# Patient Record
Sex: Female | Born: 2005 | Race: White | Hispanic: No | Marital: Single | Smoking: Never smoker
Health system: Southern US, Community
[De-identification: ages and names within clinical notes are randomized; demographics above are authoritative.]

## PROBLEM LIST (undated history)

## (undated) DIAGNOSIS — F32A Depression, unspecified: Secondary | ICD-10-CM

## (undated) DIAGNOSIS — F909 Attention-deficit hyperactivity disorder, unspecified type: Secondary | ICD-10-CM

## (undated) DIAGNOSIS — G43909 Migraine, unspecified, not intractable, without status migrainosus: Secondary | ICD-10-CM

---
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
## 2024-01-03 NOTE — Progress Notes (Signed)
 Chief Complaint:   Hematuria    History of Present Illness   Ms. Vanwyk is a 18 y.o. female G0P0000 here for UTI symptoms  Patient concerns:  Chief Complaint:  Onset: about 1 week Duration: worse first thing in the morning and last thing at night Characteristics: blood in urine, suprapubic pain at the end of stream, urgency and frequency Treatment: AZO Associated signs and symptoms: denies vaginal symptoms 10/23/23 wet prep pos for BV; 09/06/23 wet prep pos for BV; 06/07/22 urine culture pos for GBS; 09/28/21 urine culture pos for E. coli   GYNHx:  - Patient's last menstrual period was 12/15/2023.SABRA    Sexually active: active, 1 female partner.  Current contraception: Condoms   Wt Mgmt Body mass index is 27.12 kg/m.  OB/GYN History:  OB History  Gravida Para Term Preterm AB Living  0 0 0 0 0 0  SAB IAB Ectopic Molar Multiple Live Births  0 0 0 0 0 0    Problem list: Patient Active Problem List  Diagnosis  . ADHD (attention deficit hyperactivity disorder), combined type  . Depression, endogenous (CMS/HHS-HCC)    Medical history: Past Medical History:  Diagnosis Date  . ADHD (attention deficit hyperactivity disorder)   . Depression   . Migraines     Past surgical history: Past Surgical History:  Procedure Laterality Date  . wisdom teeth extractions  02/2022  . ESOPHAGOGASTRODOUDENOSCOPY W/BIOPSY N/A 09/20/2022   Procedure: ESOPHAGOGASTRODUODENOSCOPY, FLEXIBLE, TRANSORAL; WITH BIOPSY, SINGLE OR MULTIPLE;  Surgeon: Marcus Charlie Pac, MD;  Location: [facility];  Service: Pediatric Gastroenterology;  Laterality: N/A;  Please get disaccharideases    Medications:  Current Outpatient Medications:  .  brompheniramine-pseudoephed-DM (BROMFED DM) 2-30-10 mg/5 mL syrup, Take 5 mLs by mouth every 6 (six) hours as needed (Patient not taking: Reported on 09/06/2023), Disp: 118 mL, Rfl: 0 .  chlorhexidine (PERIDEX) 0.12 % solution, , Disp: , Rfl:  .   dexmethylphenidate (FOCALIN) 5 MG tablet, , Disp: , Rfl:  .  DULoxetine  (CYMBALTA ) 60 MG DR capsule, Take 60 mg by mouth once daily (Patient not taking: Reported on 09/06/2023), Disp: , Rfl:  .  hydrOXYzine  (ATARAX ) 25 MG tablet, Take by mouth (Patient not taking: Reported on 09/06/2023), Disp: , Rfl:  .  multivit-min/ferrous fumarate (MULTI VITAMIN ORAL), Take by mouth once daily Teen, takes 2 QD (Patient not taking: Reported on 01/03/2024), Disp: , Rfl:  .  ondansetron  (ZOFRAN -ODT) 4 MG disintegrating tablet, , Disp: , Rfl:  .  XULANE 150-35 mcg/24 hr patch, Place 1 patch onto the skin once a week (Patient not taking: Reported on 10/23/2023), Disp: 12 patch, Rfl: 3  Allergies: No Known Allergies  Social History: Social History   Socioeconomic History  . Marital status: Single  Tobacco Use  . Smoking status: Never  . Smokeless tobacco: Never  Vaping Use  . Vaping status: Never Used  Substance and Sexual Activity  . Alcohol use: Never  . Drug use: Never  . Sexual activity: Defer    Birth control/protection: Patch  Social History Narrative   Parents are divorced, split custody stays  1 week with each parent, has 1 half brother and 1 half sister   Dad smokes outside   Pets dogs   Social Drivers of Health   Housing Stability: Unknown (10/23/2023)   Housing Stability Vital Sign   . Homeless in the Last Year: No    Family History: Family History  Problem Relation Name Age of Onset  . Thyroid  disease Mother    . No Known Problems Father    . Breast cancer Maternal Grandmother    . Diabetes type II Maternal Grandmother    . Thyroid disease Maternal Grandmother    . Anesthesia problems Neg Hx       Review of Systems:   ROS: see HPI for pertinent positives and negatives, otherwise a 10 system review is negative.    Exam:  Constitutional: Vitals:   01/03/24 0830  BP: 114/72  Pulse: 64   Body mass index is 27.12 kg/m.  General Appearance:    Well-developed,  well-nourished, no acute distress, appears stated age  Lungs:     Respirations unlabored  Neuro: Psych:   Alert, oriented x3  Appropriate mood and insight, judgement intact   Results for orders placed or performed in visit on 01/03/24  Urinalysis w/Microscopic  Result Value Ref Range   Color Yellow Colorless, Straw, Light Yellow, Yellow, Dark Yellow   Clarity Cloudy (!) Clear   Specific Gravity 1.017 1.005 - 1.030   pH, Urine 6.5 5.0 - 8.0   Protein, Urinalysis Trace (!) Negative mg/dL   Glucose, Urinalysis Negative Negative mg/dL   Ketones, Urinalysis Negative Negative mg/dL   Blood, Urinalysis 1+ (!) Negative   Nitrite, Urinalysis Negative Negative   Leukocyte Esterase, Urinalysis 3+ (!) Negative   Bilirubin, Urinalysis Negative Negative   Urobilinogen, Urinalysis 0.2 0.2 - 1.0 mg/dL   WBC, UA >817 (H) <=5 /hpf   Red Blood Cells, Urinalysis 4 (H) <=3 /hpf   Bacteria, Urinalysis 0-5 0 - 5 /hpf   Squamous Epithelial Cells, Urinalysis 4 /hpf  Wet Prep  Result Value Ref Range   Clue Cells, Vaginal Present (!) None Seen   WBC (White Blood Cells), Vaginal None Seen None Seen   Trichomonas, Vaginal None Seen None Seen   Bacteria, Vaginal Many (!) None Seen   Yeast, Vaginal None Seen None Seen   Narrative   Whiff test positive      Impression:   Ms. Sovine is a 18 y.o. G17P0000 female here for UTI symptoms   The primary encounter diagnosis was Urinary frequency. A diagnosis of Pelvic pain was also pertinent to this visit.  Plan:   1. Urinary frequency -     Urinalysis w/Microscopic -     Urine Culture, Routine - [facility]  2. Pelvic pain -     Wet Prep   Requested Prescriptions   Pending Prescriptions Disp Refills  . nitrofurantoin, macrocrystal-monohydrate, (MACROBID) 100 MG capsule 10 capsule 0    Sig: Take 1 capsule (100 mg total) by mouth every 12 (twelve) hours for 5 days  . metroNIDAZOLE (FLAGYL) 500 MG tablet 14 tablet 0    Sig: Take 1 tablet (500 mg total) by  mouth 2 (two) times daily for 7 days    Orders Placed This Encounter  Procedures  . Urine Culture, Routine - [facility]    Order Specific Question:   Release to patient    Answer:   Immediate  . Urinalysis w/Microscopic    Order Specific Question:   Release to patient    Answer:   Immediate  . Wet Prep    Order Specific Question:   Release to patient    Answer:   Immediate    Problem List Items Addressed This Visit   None Visit Diagnoses     Urinary frequency    -  Primary   Relevant Orders   Urinalysis w/Microscopic (Completed)   Urine Culture,  Routine - [facility]   Pelvic pain       Relevant Orders   Wet Prep (Completed)       Return if symptoms worsen or fail to improve.   Attestation Statement:   I personally performed the service, non-incident to. [facility])   JENNIFER RICHARDSON MYRON DELON MYRON, CNM 01/03/2024 9:41 AM

## 2024-02-29 NOTE — Progress Notes (Signed)
 Subjective:     Patient ID: Angela Reed is a 18 y.o.  She does not play contact sports  Sore Throat  This is a new problem. The current episode started in the past 7 days (3d). The problem has been unchanged. Neither side of throat is experiencing more pain than the other. There has been no fever. The pain is mild. Associated symptoms include coughing and headaches. Pertinent negatives include no abdominal pain, congestion, diarrhea, drooling, ear discharge, ear pain, hoarse voice, plugged ear sensation, neck pain, shortness of breath, swollen glands, trouble swallowing or vomiting. She has had no exposure to strep or mono. She has tried nothing for the symptoms.    The following portions of the patient's history were reviewed and updated as appropriate.  Past Medical History:  has a past medical history of ADHD (attention deficit hyperactivity disorder), Depression, and Migraines.  Problem List: has ADHD (attention deficit hyperactivity disorder), combined type and Depression, endogenous (CMS/HHS-HCC) on their problem list.  Past Surgical History:  has a past surgical history that includes wisdom teeth extractions (02/2022) and esophagogastrodoudenoscopy w/biopsy (N/A, 09/20/2022).  Family History: family history includes Breast cancer in her maternal grandmother; Diabetes type II in her maternal grandmother; No Known Problems in her father; Thyroid disease in her maternal grandmother and mother.  Social History:  reports that she has never smoked. She has never been exposed to tobacco smoke. She has never used smokeless tobacco. She reports that she does not drink alcohol and does not use drugs.  Current Medications: has a current medication list which includes the following prescription(s): brompheniramine-pseudoephed-dm, chlorhexidine, dexmethylphenidate, duloxetine , hydroxyzine , multivit-min/ferrous fumarate, ondansetron , and xulane.  Prior to encounter Medications:  Current  Outpatient Medications on File Prior to Visit  Medication Sig Dispense Refill  . brompheniramine-pseudoephed-DM (BROMFED DM) 2-30-10 mg/5 mL syrup Take 5 mLs by mouth every 6 (six) hours as needed (Patient not taking: Reported on 02/29/2024) 118 mL 0  . chlorhexidine (PERIDEX) 0.12 % solution  (Patient not taking: Reported on 02/29/2024)    . dexmethylphenidate (FOCALIN) 5 MG tablet  (Patient not taking: Reported on 01/16/2023)    . DULoxetine  (CYMBALTA ) 60 MG DR capsule Take 60 mg by mouth once daily (Patient not taking: Reported on 02/29/2024)    . hydrOXYzine  (ATARAX ) 25 MG tablet Take by mouth (Patient not taking: Reported on 02/29/2024)    . multivit-min/ferrous fumarate (MULTI VITAMIN ORAL) Take by mouth once daily Teen, takes 2 QD (Patient not taking: Reported on 02/29/2024)    . ondansetron  (ZOFRAN -ODT) 4 MG disintegrating tablet  (Patient not taking: Reported on 02/29/2024)    . XULANE 150-35 mcg/24 hr patch Place 1 patch onto the skin once a week (Patient not taking: Reported on 10/23/2023) 12 patch 3   No current facility-administered medications on file prior to visit.    Allergies: has No Known Allergies.  Review of Systems  Constitutional:  Negative for chills, fever and malaise/fatigue.  HENT:  Positive for sore throat. Negative for congestion, drooling, ear discharge, ear pain, hoarse voice, sinus pain, tinnitus and trouble swallowing.   Eyes:  Negative for pain, discharge and redness.  Respiratory:  Positive for cough. Negative for sputum production, shortness of breath and wheezing.   Cardiovascular:  Negative for chest pain.  Gastrointestinal:  Negative for abdominal pain, diarrhea, nausea and vomiting.  Musculoskeletal:  Negative for myalgias and neck pain.  Skin:  Negative for rash.  Neurological:  Positive for headaches.       Objective:  Physical Exam Vitals and nursing note reviewed.  Constitutional:      General: She is not in acute distress.    Appearance: Normal  appearance. She is well-developed. She is not ill-appearing.  HENT:     Head: Normocephalic and atraumatic.     Right Ear: Tympanic membrane, ear canal and external ear normal.     Left Ear: Tympanic membrane, ear canal and external ear normal.     Nose: Nose normal. No congestion or rhinorrhea.     Mouth/Throat:     Mouth: Mucous membranes are moist.     Pharynx: Posterior oropharyngeal erythema (mild) and postnasal drip present. No oropharyngeal exudate.     Tonsils: No tonsillar exudate or tonsillar abscesses. 0 on the right. 0 on the left.  Eyes:     Conjunctiva/sclera: Conjunctivae normal.     Pupils: Pupils are equal, round, and reactive to light.  Neck:     Thyroid: No thyromegaly.     Trachea: No tracheal deviation.  Cardiovascular:     Rate and Rhythm: Normal rate and regular rhythm.     Heart sounds: Normal heart sounds.  Pulmonary:     Effort: Pulmonary effort is normal.     Breath sounds: Normal breath sounds.  Chest:     Chest wall: No tenderness.  Abdominal:     General: Abdomen is flat. There is no distension.     Palpations: There is no splenomegaly.     Tenderness: There is no abdominal tenderness.  Musculoskeletal:        General: Normal range of motion.     Cervical back: Normal range of motion and neck supple.  Lymphadenopathy:     Cervical: No cervical adenopathy.  Skin:    General: Skin is warm and dry.  Neurological:     General: No focal deficit present.     Mental Status: She is alert and oriented to person, place, and time.  Psychiatric:        Mood and Affect: Mood normal.        Behavior: Behavior normal.     Results for orders placed or performed in visit on 02/29/24  Xpert Dx Strep A, PCR - [facility]   Specimen: Throat  Result Value Ref Range   Xpress Strep A, PCR Not Detected Not Detected, INVALID  Pregnancy Test (HCG), Urine Qualitative  Result Value Ref Range   Pregnancy Test (HCG), Qual Urine Negative Negative  Mononucleosis Spot  Test  Result Value Ref Range   Mononucleosis Spot Test Negative Negative   Narrative   Caution: False negative results are more likely in children under 27 years of age.  CBC w/auto Differential (5 Part)  Result Value Ref Range   WBC (White Blood Cell Count) 5.2 4.1 - 10.2 10^3/uL   RBC (Red Blood Cell Count) 4.47 4.04 - 5.48 10^6/uL   Hemoglobin 12.4 12.0 - 15.0 gm/dL   Hematocrit 61.1 64.9 - 47.0 %   MCV (Mean Corpuscular Volume) 86.8 80.0 - 100.0 fl   MCH (Mean Corpuscular Hemoglobin) 27.7 27.0 - 31.2 pg   MCHC (Mean Corpuscular Hemoglobin Concentration) 32.0 32.0 - 36.0 gm/dL   Platelet Count 758 849 - 450 10^3/uL   RDW-CV (Red Cell Distribution Width) 13.9 11.6 - 14.8 %   MPV (Mean Platelet Volume) 10.5 9.4 - 12.4 fl   Neutrophils 2.53 1.50 - 7.80 10^3/uL   Lymphocytes 1.87 1.00 - 3.60 10^3/uL   Monocytes 0.66 0.00 - 1.50 10^3/uL   Eosinophils 0.07  0.00 - 0.55 10^3/uL   Basophils 0.04 0.00 - 0.09 10^3/uL   Neutrophil % 48.8 32.0 - 70.0 %   Lymphocyte % 36.1 28.0 - 48.0 %   Monocyte % 12.7 4.0 - 13.0 %   Eosinophil % 1.4 1.0 - 5.0 %   Basophil% 0.8 0.0 - 2.0 %   Immature Granulocyte % 0.2 <=0.7 %   Immature Granulocyte Count 0.01 <=0.06 10^3/L      Assessment:     1. Viral pharyngitis  2. Sore throat, unspecified -     Xpert Dx Strep A, PCR - [facility] -     Mononucleosis Spot Test -     CBC w/auto Differential (5 Part)  3. Possible pregnancy -     Pregnancy Test (HCG), Urine Qualitative       Plan:     Patient Instructions  Your strep and mono are negative today. You likely have a viral sore throat that will resolve on its own in a few days. Warm salt water gargles and hot teas with honey can help. Tylenol and ibuprofen are helpful for pain. If symptoms worsen or persist, please return to clinic.  Try OTC flonase to help with postnasal drip that may be contributing to your sore throat.

## 2024-05-14 ENCOUNTER — Encounter: Payer: Self-pay | Admitting: Emergency Medicine

## 2024-05-14 ENCOUNTER — Emergency Department
Admission: EM | Admit: 2024-05-14 | Discharge: 2024-05-14 | Disposition: A | Discharge status: Home / Self Care | Attending: Emergency Medicine | Admitting: Emergency Medicine

## 2024-05-14 ENCOUNTER — Other Ambulatory Visit: Payer: Self-pay

## 2024-05-14 DIAGNOSIS — R197 Diarrhea, unspecified: Secondary | ICD-10-CM | POA: Insufficient documentation

## 2024-05-14 DIAGNOSIS — R109 Unspecified abdominal pain: Secondary | ICD-10-CM | POA: Diagnosis present

## 2024-05-14 DIAGNOSIS — R112 Nausea with vomiting, unspecified: Secondary | ICD-10-CM | POA: Insufficient documentation

## 2024-05-14 LAB — URINALYSIS, ROUTINE W REFLEX MICROSCOPIC
Bilirubin Urine: NEGATIVE
Glucose, UA: NEGATIVE mg/dL
Hgb urine dipstick: NEGATIVE
Ketones, ur: 80 mg/dL — AB
Nitrite: NEGATIVE
Protein, ur: 100 mg/dL — AB
Specific Gravity, Urine: 1.028 (ref 1.005–1.030)
pH: 5 (ref 5.0–8.0)

## 2024-05-14 LAB — COMPREHENSIVE METABOLIC PANEL WITH GFR
ALT: 12 U/L (ref 0–44)
AST: 18 U/L (ref 15–41)
Albumin: 4.4 g/dL (ref 3.5–5.0)
Alkaline Phosphatase: 68 U/L (ref 38–126)
Anion gap: 16 — ABNORMAL HIGH (ref 5–15)
BUN: 9 mg/dL (ref 6–20)
CO2: 17 mmol/L — ABNORMAL LOW (ref 22–32)
Calcium: 9.4 mg/dL (ref 8.9–10.3)
Chloride: 103 mmol/L (ref 98–111)
Creatinine, Ser: 0.76 mg/dL (ref 0.44–1.00)
GFR, Estimated: >60 mL/min (ref >60)
Glucose, Bld: 78 mg/dL (ref 70–99)
Potassium: 3.5 mmol/L (ref 3.5–5.1)
Sodium: 136 mmol/L (ref 135–145)
Total Bilirubin: 1 mg/dL (ref 0.0–1.2)
Total Protein: 8 g/dL (ref 6.5–8.1)

## 2024-05-14 LAB — CBC
HCT: 40.1 % (ref 36.0–46.0)
Hemoglobin: 12.9 g/dL (ref 12.0–15.0)
MCH: 27.3 pg (ref 26.0–34.0)
MCHC: 32.2 g/dL (ref 30.0–36.0)
MCV: 84.8 fL (ref 80.0–100.0)
Platelets: 316 K/uL (ref 150–400)
RBC: 4.73 MIL/uL (ref 3.87–5.11)
RDW: 14.3 % (ref 11.5–15.5)
WBC: 5.5 K/uL (ref 4.0–10.5)
nRBC: 0 % (ref 0.0–0.2)

## 2024-05-14 LAB — LIPASE, BLOOD: Lipase: 28 U/L (ref 11–51)

## 2024-05-14 LAB — HCG, QUANTITATIVE, PREGNANCY: hCG, Beta Chain, Quant, S: <1 m[IU]/mL (ref <5)

## 2024-05-14 MED ORDER — LACTATED RINGERS IV BOLUS
1000.0000 mL | Freq: Once | INTRAVENOUS | Status: AC
  Administered 2024-05-14: 1000 mL via INTRAVENOUS

## 2024-05-14 MED ORDER — ONDANSETRON HCL 4 MG/2ML IJ SOLN
4.0000 mg | Freq: Once | INTRAMUSCULAR | Status: AC
  Administered 2024-05-14: 4 mg via INTRAVENOUS
  Filled 2024-05-14: qty 2

## 2024-05-14 MED ORDER — DROPERIDOL 2.5 MG/ML IJ SOLN
1.2500 mg | Freq: Once | INTRAMUSCULAR | Status: AC
  Administered 2024-05-14: 1.25 mg via INTRAVENOUS
  Filled 2024-05-14: qty 2

## 2024-05-14 NOTE — ED Notes (Signed)
 Patient ambulated with a steady gait to hallway bathroom

## 2024-05-14 NOTE — ED Notes (Signed)
 Patient denies vomiting, but states she is spitting up saliva and dry heaving.

## 2024-05-14 NOTE — ED Provider Notes (Signed)
 Angela Reed Provider Note    Event Date/Time   First MD Initiated Contact with Patient 05/14/24 1010     (approximate)   History   Diarrhea   HPI  Angela Reed is a 18 y.o. female with history of anxiety, MDD, presenting with abdominal pain and diarrhea.  Has been ongoing for the past week.  Also notes intermittent nausea vomiting.  Does state that she also a 10-15 pound weight loss due to decreased p.o. intake secondary to the nausea.  She denies any urinary symptoms, no vaginal discharge or bleeding, states that she just completed her period and her cycles are pretty regular.  She denies any fever or recent travel.  No sick contacts.  No family history of GI cancers or IBD.  She does not have any history of IBD, IBS, celiac's disease.  She denies any hematochezia or melena.  States that she had an endoscopy in the past for nausea vomiting and crampy abdominal pain, states that the endoscopy was negative and her symptoms were attributed to anxiety.  Patient states that she has been under a lot of stress lately.  Went to see her primary care doctor yesterday and was put on azithromycin for possible traveler's diarrhea.  She denies any marijuana use.  No other new medications.  On independent review, was seen by OB in March of this year, is sexually active with 1 female partner, does have prior history of BV.  Patient states that she does not have prior STI history.     Physical Exam   Triage Vital Signs: ED Triage Vitals  Encounter Vitals Group     BP 05/14/24 0932 130/89     Girls Systolic BP Percentile --      Girls Diastolic BP Percentile --      Boys Systolic BP Percentile --      Boys Diastolic BP Percentile --      Pulse Rate 05/14/24 0932 85     Resp 05/14/24 0932 16     Temp 05/14/24 0932 98.3 F (36.8 C)     Temp Source 05/14/24 0932 Oral     SpO2 05/14/24 0932 99 %     Weight 05/14/24 0927 159 lb (72.1 kg)     Height 05/14/24 0927 5' 5  (1.651 m)     Head Circumference --      Peak Flow --      Pain Score 05/14/24 0927 3     Pain Loc --      Pain Education --      Exclude from Growth Chart --     Most recent vital signs: Vitals:   05/14/24 1245 05/14/24 1300  BP: (!) 114/51   Pulse: 100 73  Resp:    Temp:    SpO2: 100% 100%     General: Awake, no distress.  CV:  Good peripheral perfusion.  Resp:  Normal effort.  Abd:  No distention. Soft nontender to deep palpation Other:  Mildly dry oral mucosa   ED Results / Procedures / Treatments   Labs (all labs ordered are listed, but only abnormal results are displayed) Labs Reviewed  COMPREHENSIVE METABOLIC PANEL WITH GFR - Abnormal; Notable for the following components:      Result Value   CO2 17 (*)    Anion gap 16 (*)    All other components within normal limits  URINALYSIS, ROUTINE W REFLEX MICROSCOPIC - Abnormal; Notable for the following components:  Color, Urine YELLOW (*)    APPearance CLOUDY (*)    Ketones, ur 80 (*)    Protein, ur 100 (*)    Leukocytes,Ua TRACE (*)    Bacteria, UA RARE (*)    All other components within normal limits  LIPASE, BLOOD  CBC  HCG, QUANTITATIVE, PREGNANCY     EKG  EKG shows, sinus rhythm with sinus arrhythmia, rate 68, normal QRS, normal QTc, no obvious ST elevation, T wave flattening in aVL, no prior to compare    PROCEDURES:  Critical Care performed: No  Procedures   MEDICATIONS ORDERED IN ED: Medications  lactated ringers  bolus 1,000 mL (0 mLs Intravenous Stopped 05/14/24 1327)  ondansetron  (ZOFRAN ) injection 4 mg (4 mg Intravenous Given 05/14/24 1123)  droperidol  (INAPSINE ) 2.5 MG/ML injection 1.25 mg (1.25 mg Intravenous Given 05/14/24 1328)     IMPRESSION / MDM / ASSESSMENT AND PLAN / ED COURSE  I reviewed the triage vital signs and the nursing notes.                              Differential diagnosis includes, but is not limited to, viral illness, IBS, celiac's disease, IBD, did consider  colitis colitis, diverticulitis but she has no abdominal pain at this on exam, also considered malignancy or mass given the weight loss, patient has no family history of GI cancers or malignancy believe malignancy workup can be done outpatient.  Also considered electrolyte derangements and dehydration.  Labs, pregnancy test, UA.  Will give her some IV fluids and Zofran  here.  Once pregnancy test comes back I will give her a dose of Bentyl as well.  Patient's presentation is most consistent with acute presentation with potential threat to life or bodily function.  Independent interpretation of labs and imaging below.  On reassessment patient is doing significantly better after the droperidol , symptoms have resolved.  She is tolerating p.o.  Considered but no indication for inpatient admission at this time, she is safe for outpatient management.  Patient states that she still has some Zofran  leftover that she can take.  Will give her a number to call for GI to follow-up.   Discussed also extensively starting on clear liquid diet and then progressing from there.  Shared decision making done with patient and she is agreeable with this plan.  Discharged with strict precautions.    Clinical Course as of 05/14/24 1422  Tue May 14, 2024  1146 Independent review of labs, UA is a dirty catch, patient has no urinary symptoms, not consistent with UTI, electrolytes not severely deranged, she does have an acidosis with an anion gap, suspect this might be secondary to her dehydration, no leukocytosis.  LFTs are normal.  She is getting IV fluids. [TT]  1243 On reassessment patient still having some nausea, also with crampy abdominal pain, will give her some droperidol .  Had an EKG done, QTc is not prolonged. [TT]    Clinical Course User Index [TT] Waymond Lorelle Cummins, MD     FINAL CLINICAL IMPRESSION(S) / ED DIAGNOSES   Final diagnoses:  Nausea vomiting and diarrhea  Abdominal pain, unspecified abdominal location      Rx / DC Orders   ED Discharge Orders     None        Note:  This document was prepared using Dragon voice recognition software and may include unintentional dictation errors.    Waymond Lorelle Cummins, MD 05/14/24 [id number]

## 2024-05-14 NOTE — Discharge Instructions (Signed)
 Please make sure to keep yourself hydrated, please make sure to follow-up with gastroenterology for further management of your symptoms

## 2024-05-14 NOTE — ED Triage Notes (Signed)
 Patient to ED via POV for diarrhea/ abd pain. Ongoing since last Wednesday. Intermittent vomiting.

## 2024-05-14 NOTE — ED Notes (Signed)
 Patient states abdominal cramping has increased and still feels nauseous. Dr. Waymond aware.

## 2024-05-14 NOTE — ED Notes (Signed)
 Pt unhooked and went to the bathroom.
